# Patient Record
Sex: Female | Born: 1960 | Race: White | Hispanic: No | Marital: Married | Smoking: Never smoker
Health system: Southern US, Community
[De-identification: ages and names within clinical notes are randomized; demographics above are authoritative.]

## PROBLEM LIST (undated history)

## (undated) DIAGNOSIS — K859 Acute pancreatitis without necrosis or infection, unspecified: Secondary | ICD-10-CM

## (undated) DIAGNOSIS — J45909 Unspecified asthma, uncomplicated: Secondary | ICD-10-CM

---

## 2013-11-02 ENCOUNTER — Emergency Department: Payer: Self-pay | Admitting: Emergency Medicine

## 2013-11-02 LAB — BASIC METABOLIC PANEL
ANION GAP: 4 — AB (ref 7–16)
BUN: 16 mg/dL (ref 7–18)
CALCIUM: 9.4 mg/dL (ref 8.5–10.1)
Chloride: 104 mmol/L (ref 98–107)
Co2: 29 mmol/L (ref 21–32)
Creatinine: 0.82 mg/dL (ref 0.60–1.30)
EGFR (African American): >60
EGFR (Non-African Amer.): >60
Glucose: 107 mg/dL — ABNORMAL HIGH (ref 65–99)
Osmolality: 275 (ref 275–301)
Potassium: 3.9 mmol/L (ref 3.5–5.1)
Sodium: 137 mmol/L (ref 136–145)

## 2013-11-02 LAB — URINALYSIS, COMPLETE
Bacteria: NEGATIVE
Bilirubin,UR: NEGATIVE
Blood: NEGATIVE
Glucose,UR: NEGATIVE mg/dL (ref 0–75)
KETONE: NEGATIVE
Leukocyte Esterase: NEGATIVE
NITRITE: NEGATIVE
Ph: 6 (ref 4.5–8.0)
Protein: NEGATIVE
RBC, UR: NONE SEEN /HPF (ref 0–5)
Specific Gravity: 1.01 (ref 1.003–1.030)

## 2013-11-02 LAB — CBC WITH DIFFERENTIAL/PLATELET
BASOS PCT: 0.7 %
Basophil #: 0 10*3/uL (ref 0.0–0.1)
EOS ABS: 0.1 10*3/uL (ref 0.0–0.7)
EOS PCT: 0.9 %
HCT: 42.7 % (ref 35.0–47.0)
HGB: 14.4 g/dL (ref 12.0–16.0)
Lymphocyte #: 2.4 10*3/uL (ref 1.0–3.6)
Lymphocyte %: 34.5 %
MCH: 31.7 pg (ref 26.0–34.0)
MCHC: 33.8 g/dL (ref 32.0–36.0)
MCV: 94 fL (ref 80–100)
MONO ABS: 0.3 x10 3/mm (ref 0.2–0.9)
MONOS PCT: 5 %
NEUTROS ABS: 4 10*3/uL (ref 1.4–6.5)
NEUTROS PCT: 58.9 %
PLATELETS: 328 10*3/uL (ref 150–440)
RBC: 4.54 10*6/uL (ref 3.80–5.20)
RDW: 12.9 % (ref 11.5–14.5)
WBC: 6.8 10*3/uL (ref 3.6–11.0)

## 2013-11-02 LAB — TROPONIN I: Troponin-I: <0.02 ng/mL

## 2016-10-22 ENCOUNTER — Emergency Department
Admission: EM | Admit: 2016-10-22 | Discharge: 2016-10-22 | Disposition: A | Discharge status: Home / Self Care | Payer: Self-pay | Attending: Emergency Medicine | Admitting: Emergency Medicine

## 2016-10-22 ENCOUNTER — Encounter: Payer: Self-pay | Admitting: Emergency Medicine

## 2016-10-22 ENCOUNTER — Emergency Department: Payer: Self-pay

## 2016-10-22 DIAGNOSIS — J45909 Unspecified asthma, uncomplicated: Secondary | ICD-10-CM | POA: Insufficient documentation

## 2016-10-22 DIAGNOSIS — Z791 Long term (current) use of non-steroidal anti-inflammatories (NSAID): Secondary | ICD-10-CM | POA: Insufficient documentation

## 2016-10-22 DIAGNOSIS — R1031 Right lower quadrant pain: Secondary | ICD-10-CM | POA: Insufficient documentation

## 2016-10-22 DIAGNOSIS — R102 Pelvic and perineal pain: Secondary | ICD-10-CM | POA: Insufficient documentation

## 2016-10-22 HISTORY — DX: Unspecified asthma, uncomplicated: J45.909

## 2016-10-22 HISTORY — DX: Acute pancreatitis without necrosis or infection, unspecified: K85.90

## 2016-10-22 LAB — COMPREHENSIVE METABOLIC PANEL
ALK PHOS: 47 U/L (ref 38–126)
ALT: 17 U/L (ref 14–54)
AST: 24 U/L (ref 15–41)
Albumin: 4.4 g/dL (ref 3.5–5.0)
Anion gap: 8 (ref 5–15)
BUN: 7 mg/dL (ref 6–20)
CALCIUM: 9.2 mg/dL (ref 8.9–10.3)
CO2: 27 mmol/L (ref 22–32)
CREATININE: 0.93 mg/dL (ref 0.44–1.00)
Chloride: 102 mmol/L (ref 101–111)
GFR calc non Af Amer: >60 mL/min (ref >60)
Glucose, Bld: 95 mg/dL (ref 65–99)
Potassium: 3.4 mmol/L — ABNORMAL LOW (ref 3.5–5.1)
SODIUM: 137 mmol/L (ref 135–145)
Total Bilirubin: 0.7 mg/dL (ref 0.3–1.2)
Total Protein: 7.3 g/dL (ref 6.5–8.1)

## 2016-10-22 LAB — URINALYSIS, COMPLETE (UACMP) WITH MICROSCOPIC
Bacteria, UA: NONE SEEN
Bilirubin Urine: NEGATIVE
GLUCOSE, UA: NEGATIVE mg/dL
Hgb urine dipstick: NEGATIVE
Ketones, ur: 5 mg/dL — AB
Leukocytes, UA: NEGATIVE
Nitrite: NEGATIVE
Protein, ur: NEGATIVE mg/dL
SPECIFIC GRAVITY, URINE: 1.012 (ref 1.005–1.030)
pH: 6 (ref 5.0–8.0)

## 2016-10-22 LAB — LIPASE, BLOOD: Lipase: 15 U/L (ref 11–51)

## 2016-10-22 LAB — CBC
HCT: 37 % (ref 35.0–47.0)
Hemoglobin: 12.8 g/dL (ref 12.0–16.0)
MCH: 31.7 pg (ref 26.0–34.0)
MCHC: 34.5 g/dL (ref 32.0–36.0)
MCV: 92.1 fL (ref 80.0–100.0)
PLATELETS: 261 10*3/uL (ref 150–440)
RBC: 4.02 MIL/uL (ref 3.80–5.20)
RDW: 12.9 % (ref 11.5–14.5)
WBC: 5.6 10*3/uL (ref 3.6–11.0)

## 2016-10-22 MED ORDER — DICYCLOMINE HCL 10 MG/ML IM SOLN
20.0000 mg | Freq: Once | INTRAMUSCULAR | Status: AC
  Administered 2016-10-22: 20 mg via INTRAMUSCULAR
  Filled 2016-10-22: qty 2

## 2016-10-22 MED ORDER — ONDANSETRON HCL 4 MG/2ML IJ SOLN
4.0000 mg | Freq: Once | INTRAMUSCULAR | Status: AC
  Administered 2016-10-22: 4 mg via INTRAVENOUS
  Filled 2016-10-22: qty 2

## 2016-10-22 MED ORDER — DICYCLOMINE HCL 20 MG PO TABS: 20.0000 mg | ORAL_TABLET | Freq: Three times a day (TID) | ORAL | 0 refills | Status: AC | PRN

## 2016-10-22 MED ORDER — ONDANSETRON HCL 4 MG/2ML IJ SOLN: 4.0000 mg | Freq: Once | INTRAMUSCULAR | Status: DC | PRN

## 2016-10-22 MED ORDER — MORPHINE SULFATE (PF) 4 MG/ML IV SOLN
4.0000 mg | Freq: Once | INTRAVENOUS | Status: DC
  Filled 2016-10-22: qty 1

## 2016-10-22 MED ORDER — IOPAMIDOL (ISOVUE-300) INJECTION 61%
100.0000 mL | Freq: Once | INTRAVENOUS | Status: AC | PRN
  Administered 2016-10-22: 100 mL via INTRAVENOUS

## 2016-10-22 MED ORDER — SODIUM CHLORIDE 0.9 % IV BOLUS (SEPSIS)
1000.0000 mL | Freq: Once | INTRAVENOUS | Status: AC
  Administered 2016-10-22: 1000 mL via INTRAVENOUS

## 2016-10-22 MED ORDER — MORPHINE SULFATE (PF) 2 MG/ML IV SOLN
INTRAVENOUS | Status: AC
  Administered 2016-10-22: 4 mg via INTRAVENOUS
  Filled 2016-10-22: qty 2

## 2016-10-22 MED ORDER — CYCLOBENZAPRINE HCL 5 MG PO TABS: 5.0000 mg | ORAL_TABLET | Freq: Three times a day (TID) | ORAL | 0 refills | Status: AC | PRN

## 2016-10-22 MED ORDER — KETOROLAC TROMETHAMINE 30 MG/ML IJ SOLN
30.0000 mg | Freq: Once | INTRAMUSCULAR | Status: AC
  Administered 2016-10-22: 30 mg via INTRAVENOUS
  Filled 2016-10-22: qty 1

## 2016-10-22 MED ORDER — MORPHINE SULFATE (PF) 4 MG/ML IV SOLN
4.0000 mg | Freq: Once | INTRAVENOUS | Status: AC
Start: 2016-10-22 — End: 2016-10-22
  Administered 2016-10-22: 4 mg via INTRAVENOUS

## 2016-10-22 MED ORDER — IOPAMIDOL (ISOVUE-300) INJECTION 61%
30.0000 mL | Freq: Once | INTRAVENOUS | Status: AC | PRN
  Administered 2016-10-22: 30 mL via ORAL

## 2016-10-22 NOTE — ED Notes (Signed)
Pt remain in ultrasound at this time.

## 2016-10-22 NOTE — ED Provider Notes (Signed)
[facility] Emergency Department Provider Note   ____________________________________________   I have reviewed the triage vital signs and the nursing notes.   HISTORY  Chief Complaint Abdominal Pain   History limited by: Not Limited   HPI Ana Day is a 55 y.o. female who presents to the emergency department today because of concern for abdominal pain. It is located in the right lower quadrant. It started about 2 hours ago. Initially it was waxing and waning but has now become constant. It is a 9 out of 10. It has been accompanied by nausea. Movement makes it worse. No vomiting. No change in recent urination or defecation. No fevers.   Past Medical History:  Diagnosis Date  . Asthma   . Pancreatitis asthma    There are no active problems to display for this patient.   No past surgical history on file.  Prior to Admission medications   Not on File    Allergies Penicillins  No family history on file.  Social History Social History  Substance Use Topics  . Smoking status: Never Smoker  . Smokeless tobacco: Never Used  . Alcohol use Not on file    Review of Systems Constitutional: Negative for fever. Cardiovascular: Negative for chest pain. Respiratory: Negative for shortness of breath. Gastrointestinal: Positive for abdominal pain and nausea.  Genitourinary: Negative for dysuria. Musculoskeletal: Negative for back pain. Skin: Negative for rash. Neurological: Negative for headaches, focal weakness or numbness.  10-point ROS otherwise negative. ____________________________________________   PHYSICAL EXAM:  VITAL SIGNS: ED Triage Vitals  Enc Vitals Group     BP 10/22/16 1515 (!) 176/75     Pulse Rate 10/22/16 1515 81     Resp 10/22/16 1515 18     Temp 10/22/16 1515 98.2 F (36.8 C)     Temp Source 10/22/16 1515 Oral     SpO2 10/22/16 1515 96 %     Weight 10/22/16 1516 143 lb (64.9 kg)     Height 10/22/16 1516 5\' 2"   (1.575 m)     Head Circumference --      Peak Flow --      Pain Score 10/22/16 1516 9     Pain Loc --      Pain Edu? --      Excl. in GC? --    Constitutional: Alert and oriented. Well appearing and in no distress. Eyes: Conjunctivae are normal. Normal extraocular movements. ENT   Head: Normocephalic and atraumatic.   Nose: No congestion/rhinnorhea.   Mouth/Throat: Mucous membranes are moist.   Neck: No stridor. Hematological/Lymphatic/Immunilogical: No cervical lymphadenopathy. Cardiovascular: Normal rate, regular rhythm.  No murmurs, rubs, or gallops. Respiratory: Normal respiratory effort without tachypnea nor retractions. Breath sounds are clear and equal bilaterally. No wheezes/rales/rhonchi. Gastrointestinal: Soft and non distended. Tender to palpation in the right lower quadrant. No hernia appreciated. No rebound. No guarding. Genitourinary: Deferred Musculoskeletal: Normal range of motion in all extremities. No lower extremity edema. Neurologic:  Normal speech and language. No gross focal neurologic deficits are appreciated.  Skin:  Skin is warm, dry and intact. No rash noted. Psychiatric: Mood and affect are normal. Speech and behavior are normal. Patient exhibits appropriate insight and judgment.  ____________________________________________    LABS (pertinent positives/negatives)  Labs Reviewed  COMPREHENSIVE METABOLIC PANEL - Abnormal; Notable for the following:       Result Value   Potassium 3.4 (*)    All other components within normal limits  URINALYSIS, COMPLETE (UACMP) WITH MICROSCOPIC -  Abnormal; Notable for the following:    Color, Urine YELLOW (*)    APPearance CLEAR (*)    Ketones, ur 5 (*)    Squamous Epithelial / LPF 0-5 (*)    All other components within normal limits  LIPASE, BLOOD  CBC     ____________________________________________   EKG  None  ____________________________________________    RADIOLOGY  US  transvaginal IMPRESSION:  Hysterectomy.    Lack of visualization of the ovaries secondary to overlying bowel  gas. No adnexal mass.    CT abd/pel IMPRESSION:  No acute intra-abdominopelvic finding.    Low-lying cecum extending into the pelvis. Portions of the appendix  appear unremarkable.    Remote hysterectomy     ____________________________________________   PROCEDURES  Procedures  ____________________________________________   INITIAL IMPRESSION / ASSESSMENT AND PLAN / ED COURSE  Pertinent labs & imaging results that were available during my care of the patient were reviewed by me and considered in my medical decision making (see chart for details).  Patient presented to the emergency department today because of concerns for abdominal pain. Workup here including CT scan and ultrasound without any concerning findings. Will plan on discharging patient home with muscle resting and possible muscle strain. Patient will follow up with her PCP.  ____________________________________________   FINAL CLINICAL IMPRESSION(S) / ED DIAGNOSES  Final diagnoses:  Pelvic pain  Right lower quadrant abdominal pain     Note: This dictation was prepared with Dragon dictation. Any transcriptional errors that result from this process are unintentional     Phineas SemenGraydon Berthe Oley, MD 10/22/16 2304

## 2016-10-22 NOTE — ED Notes (Signed)
Pt is in good condition; discharge instructions reviewed, follow up care and home care reviewed; prescription medication reviewed; pt verbalized understanding; pt is ambulatory and went home with husband 

## 2016-10-22 NOTE — ED Notes (Signed)
Patient transported to CT 

## 2016-10-22 NOTE — Discharge Instructions (Signed)
Please seek medical attention for any high fevers, chest pain, shortness of breath, change in behavior, persistent vomiting, bloody stool or any other new or concerning symptoms.  

## 2016-10-22 NOTE — ED Triage Notes (Signed)
Patient arrives via [facility] with complaint of RLQ Abd pain with nausea.

## 2017-09-07 IMAGING — CT CT ABD-PELV W/ CM
2 of 5 series · 16 of 46 positions shown, 18 images · IV contrast (APPLIED)
Comparison: None available

CLINICAL DATA: Right lower quadrant pain for 3 hours and nausea.
Remote hysterectomy.

EXAM:
CT ABDOMEN AND PELVIS WITH CONTRAST
TECHNIQUE: Multidetector CT imaging of the abdomen and pelvis was performed
using the standard protocol following bolus administration of
intravenous contrast.
CONTRAST:  100mL [id number] IOPAMIDOL ([id number]) INJECTION 61%

[Series 2: routine abd/pel with · axial · 0.65mm/px · z∈[-496,-151]mm · 13 of 79 slices shown, 15 images]
[im 5/79  soft-tissue]
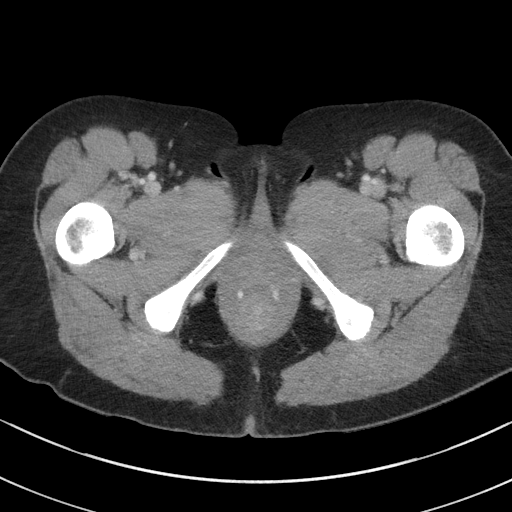
[im 5/79  bone]
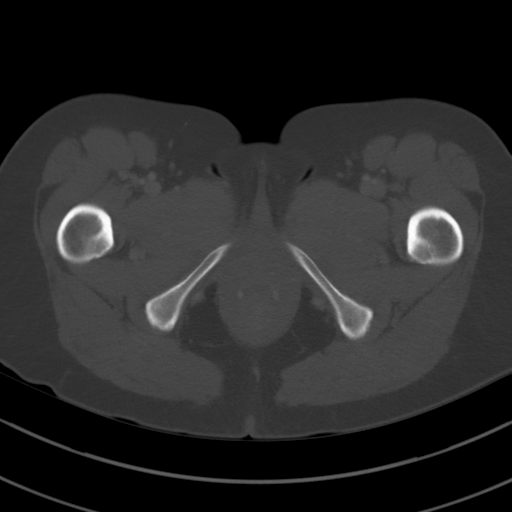
[im 9/79  soft-tissue]
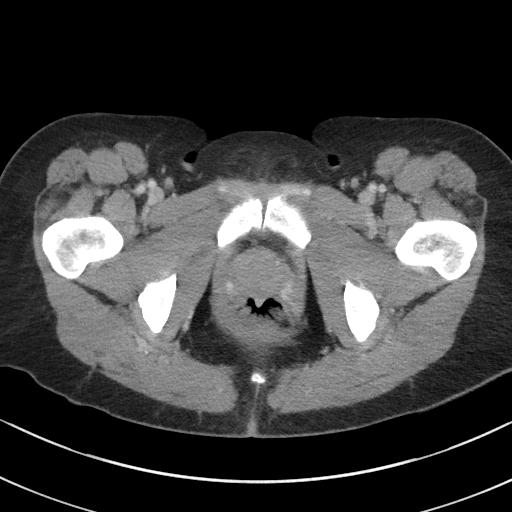
[im 18/79  soft-tissue]
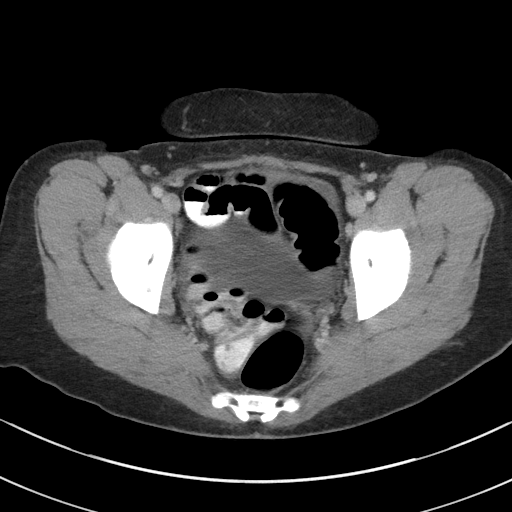
[im 22/79  soft-tissue]
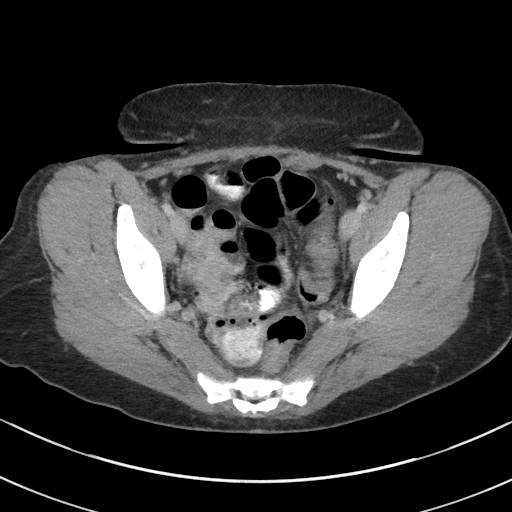
[im 27/79  soft-tissue]
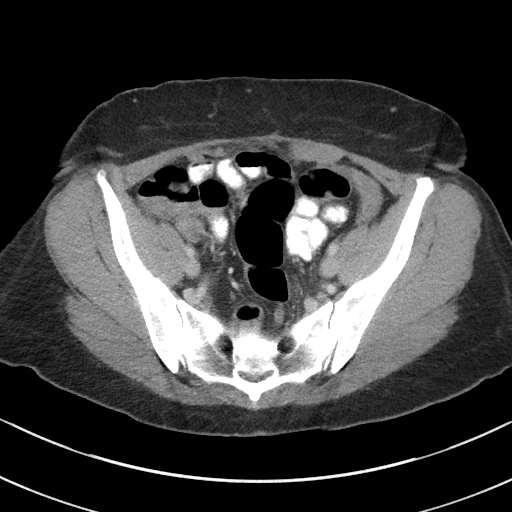
[im 35/79  soft-tissue]
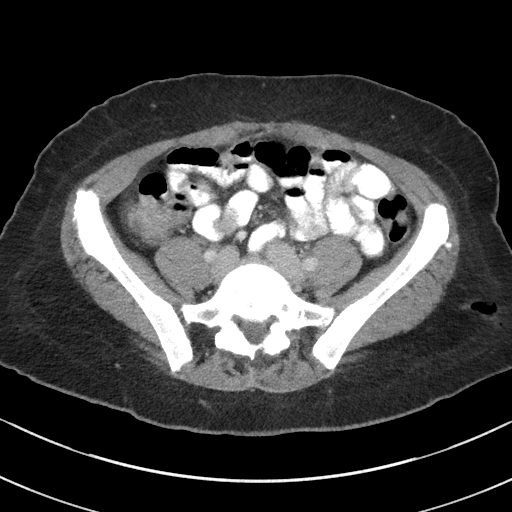
[im 40/79  soft-tissue]
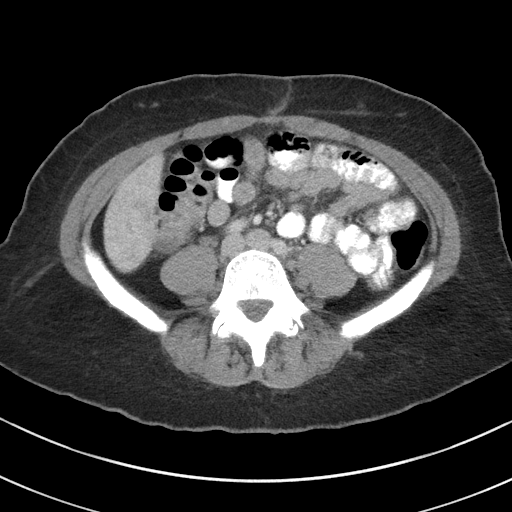
[im 44/79  soft-tissue]
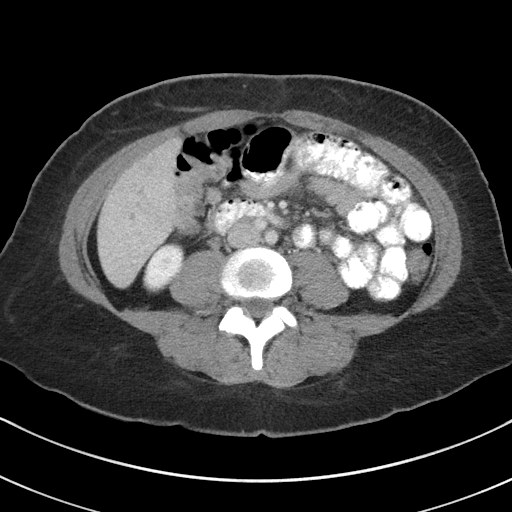
[im 53/79  soft-tissue]
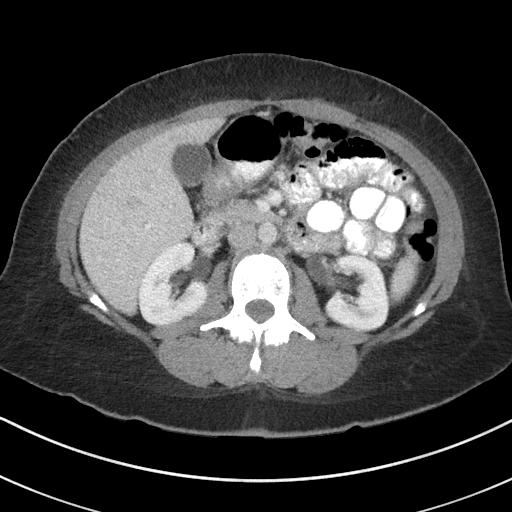
[im 53/79  bone]
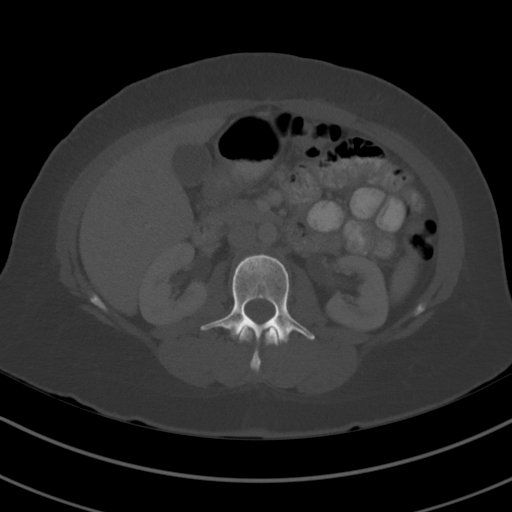
[im 57/79  soft-tissue]
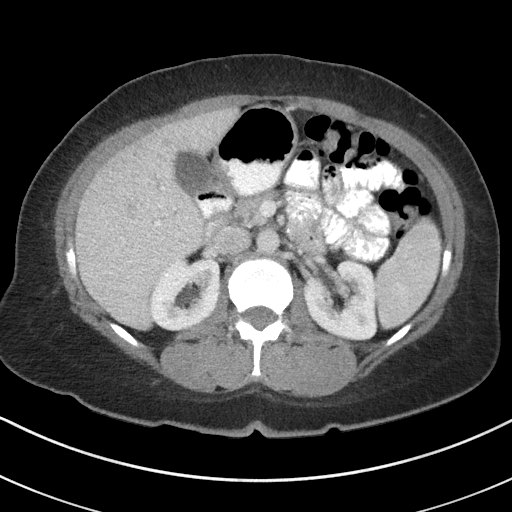
[im 61/79  soft-tissue]
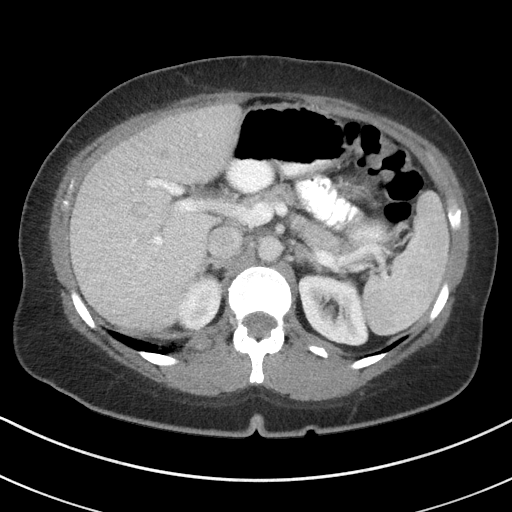
[im 70/79  soft-tissue]
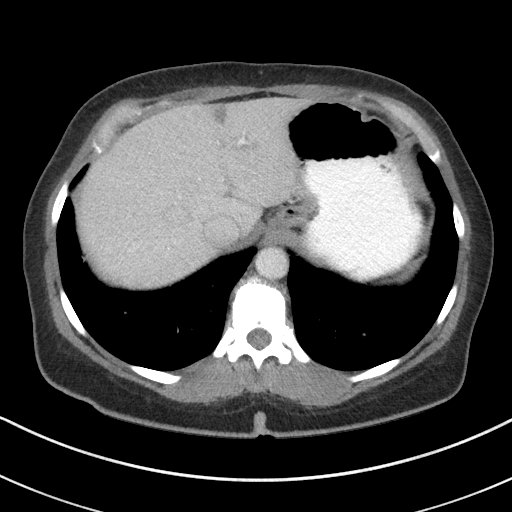
[im 74/79  soft-tissue]
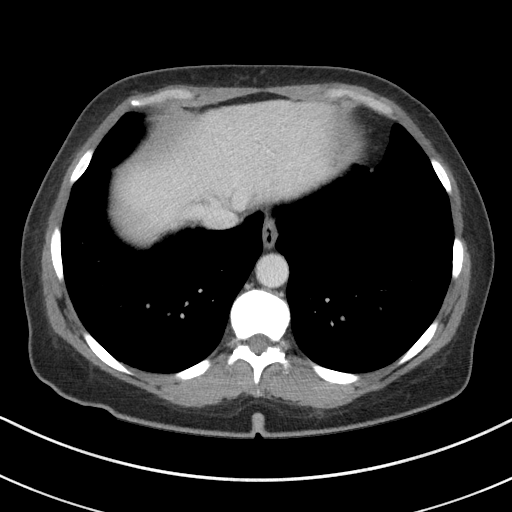

[Series 5: coronal st · coronal · 0.66mm/px · 3 of 83 slices shown]
[im 28/83  soft-tissue]
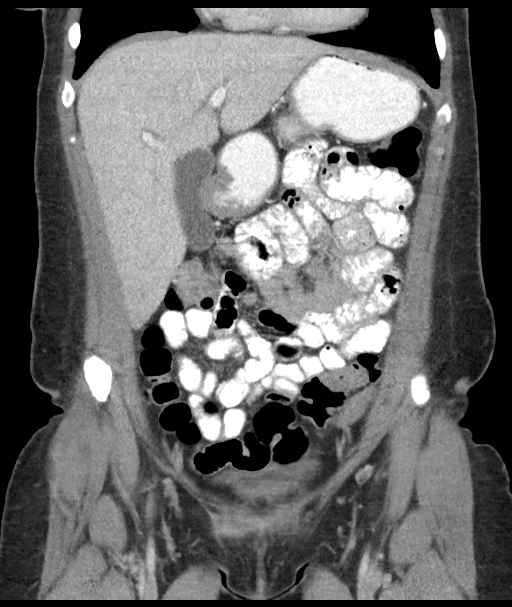
[im 37/83  soft-tissue]
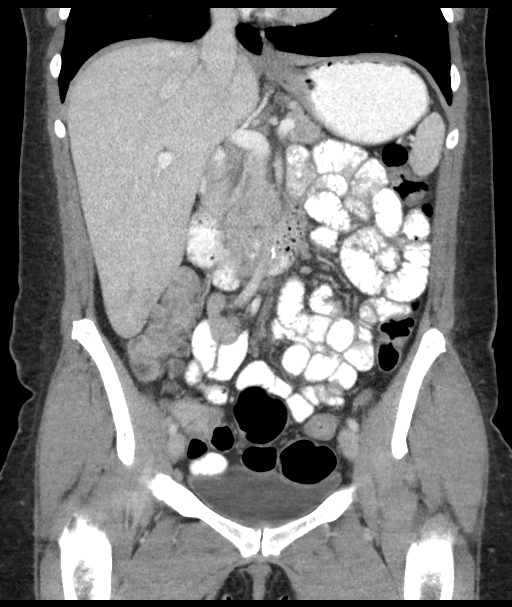
[im 46/83  soft-tissue]
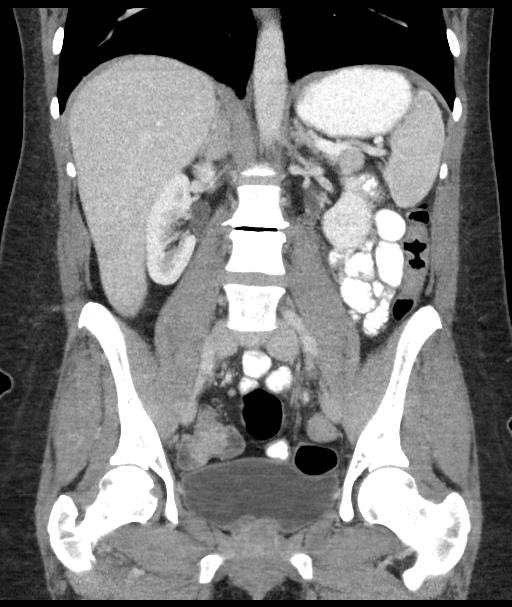

[16 of 46 positions shown; findings below may reference images not displayed]

FINDINGS: Lower chest: No acute abnormality.

Hepatobiliary: Minor focal fatty infiltration of the liver along the
falciform ligament anteriorly. No other hepatic abnormality or
biliary dilatation. Patent portal vein. Gallbladder and biliary
system unremarkable.

Pancreas: Unremarkable. No pancreatic ductal dilatation or
surrounding inflammatory changes.

Spleen: Normal in size without focal abnormality.

Adrenals/Urinary Tract: Adrenal glands are unremarkable. Kidneys are
normal, without renal calculi, focal lesion, or hydronephrosis.
Bladder is unremarkable.

Stomach/Bowel: Negative for bowel obstruction, significant
dilatation, ileus, or free air. Low-lying cecum extending into the
right hemipelvis. Portions of the appendix are demonstrated in the
pelvis, appearing unremarkable. No acute inflammatory process. No
fluid collection or abscess.

Vascular/Lymphatic: Aortic atherosclerosis noted without significant
aneurysm or occlusive process. No acute vascular finding. No
adenopathy.

Reproductive: Remote hysterectomy.

Other: No abdominal wall hernia or abnormality. No abdominopelvic
ascites.

Musculoskeletal: Thoracic and lumbar degenerative disc disease. No
acute osseous finding. Lower lumbar facet arthropathy also noted.
IMPRESSION: No acute intra-abdominopelvic finding.

Low-lying cecum extending into the pelvis. Portions of the appendix
appear unremarkable.

Remote hysterectomy
